# Patient Record
Sex: Male | Born: 2000 | Race: Black or African American | Hispanic: No | Marital: Single | Smoking: Current every day smoker
Health system: Southern US, Community
[De-identification: ages and names within clinical notes are randomized; demographics above are authoritative.]

## PROBLEM LIST (undated history)

## (undated) DIAGNOSIS — I10 Essential (primary) hypertension: Secondary | ICD-10-CM

---
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
## 2022-04-10 IMAGING — DX DG HAND COMPLETE 3+V*R*
3 series · 3 of 3 positions shown · non-contrast
Comparison: None.

CLINICAL DATA: Punched a wall, right hand injury and pain

EXAM:
RIGHT HAND - COMPLETE 3+ VIEW

[hand ap]
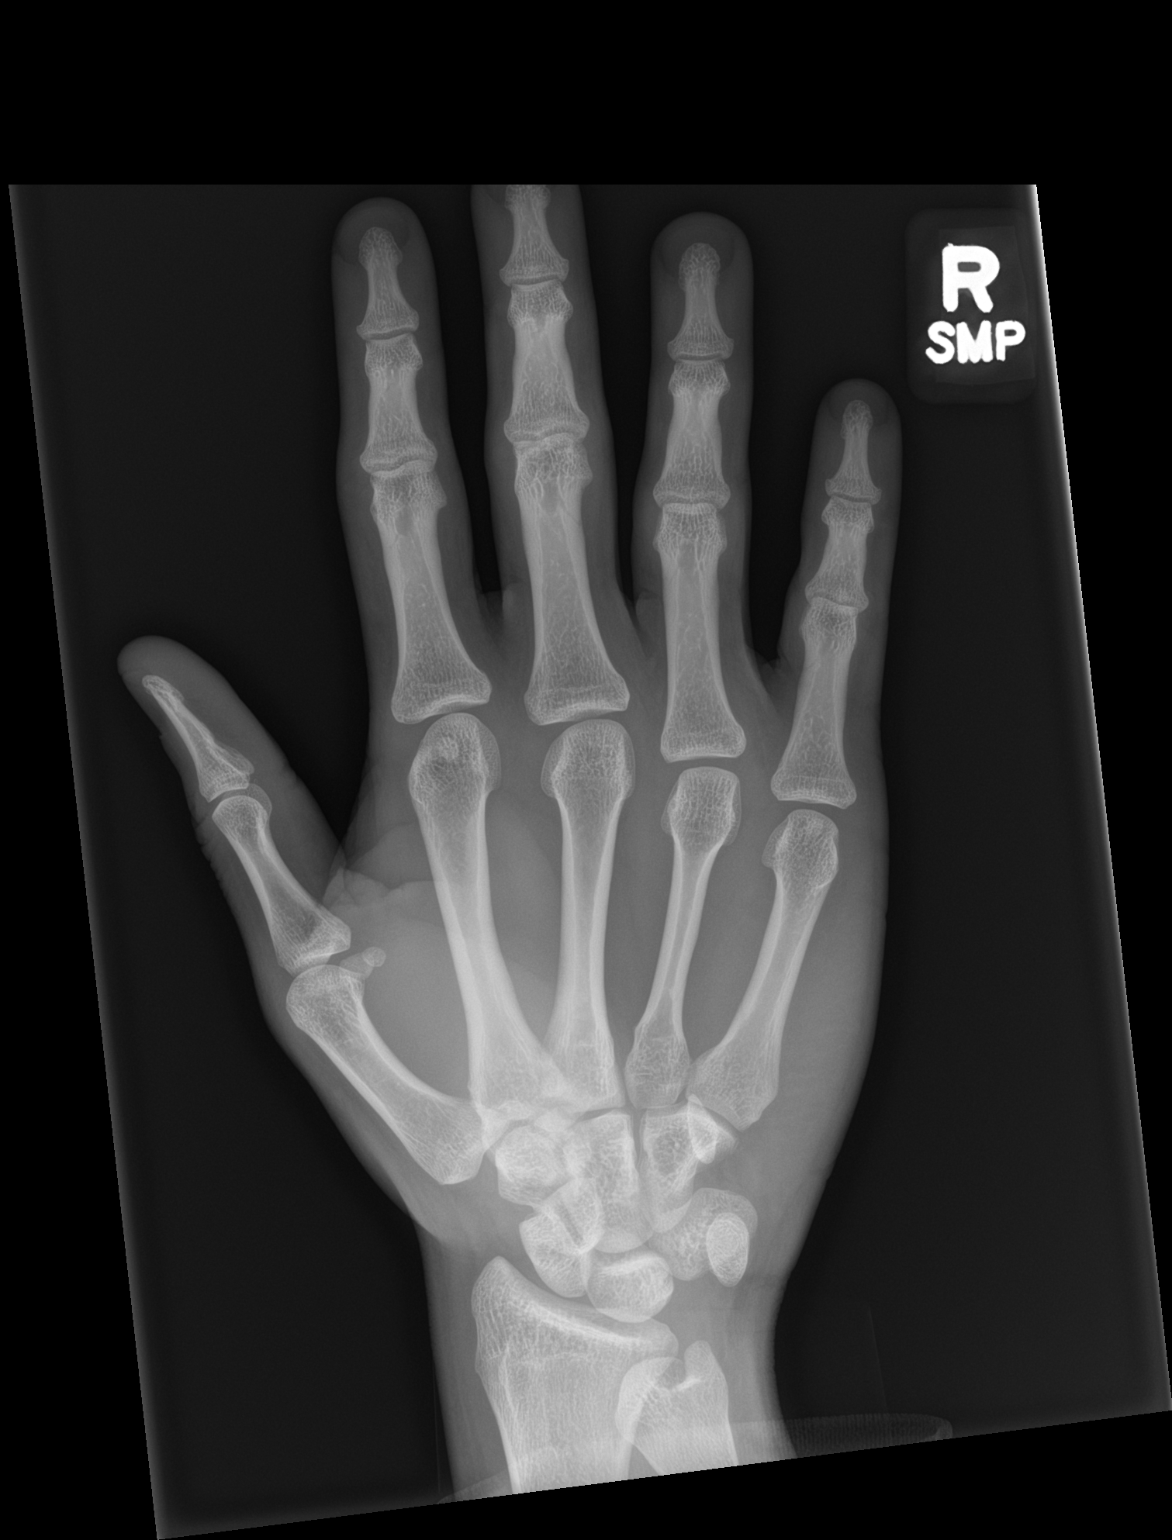

[hand obl]
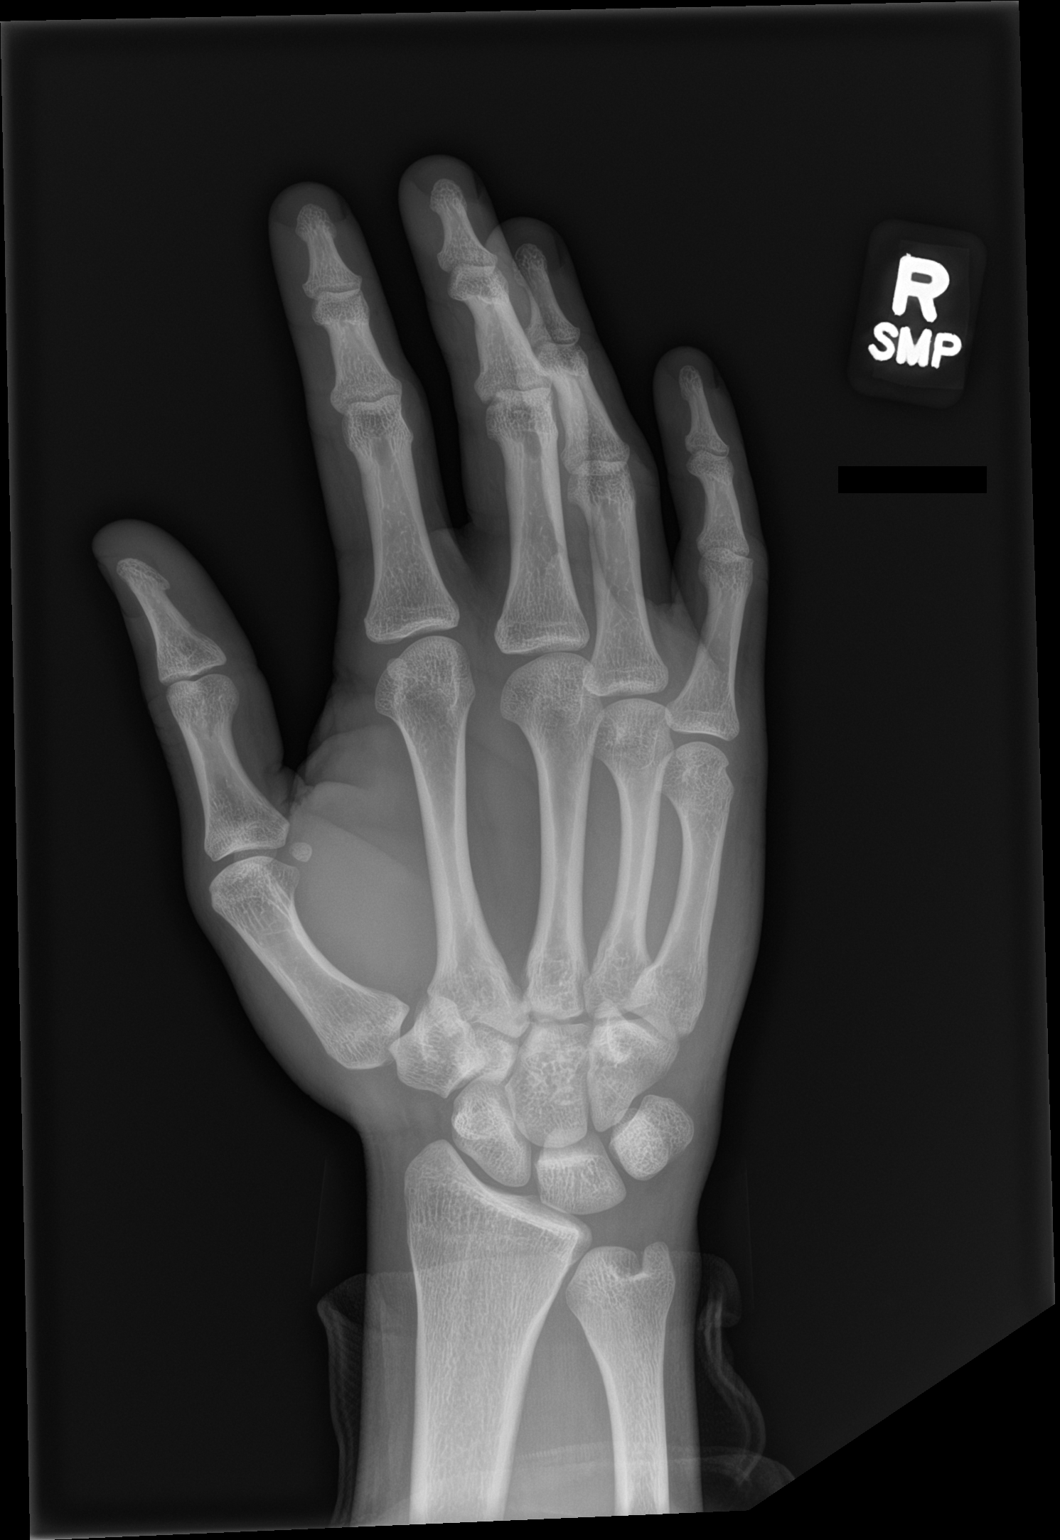

[hand lat]
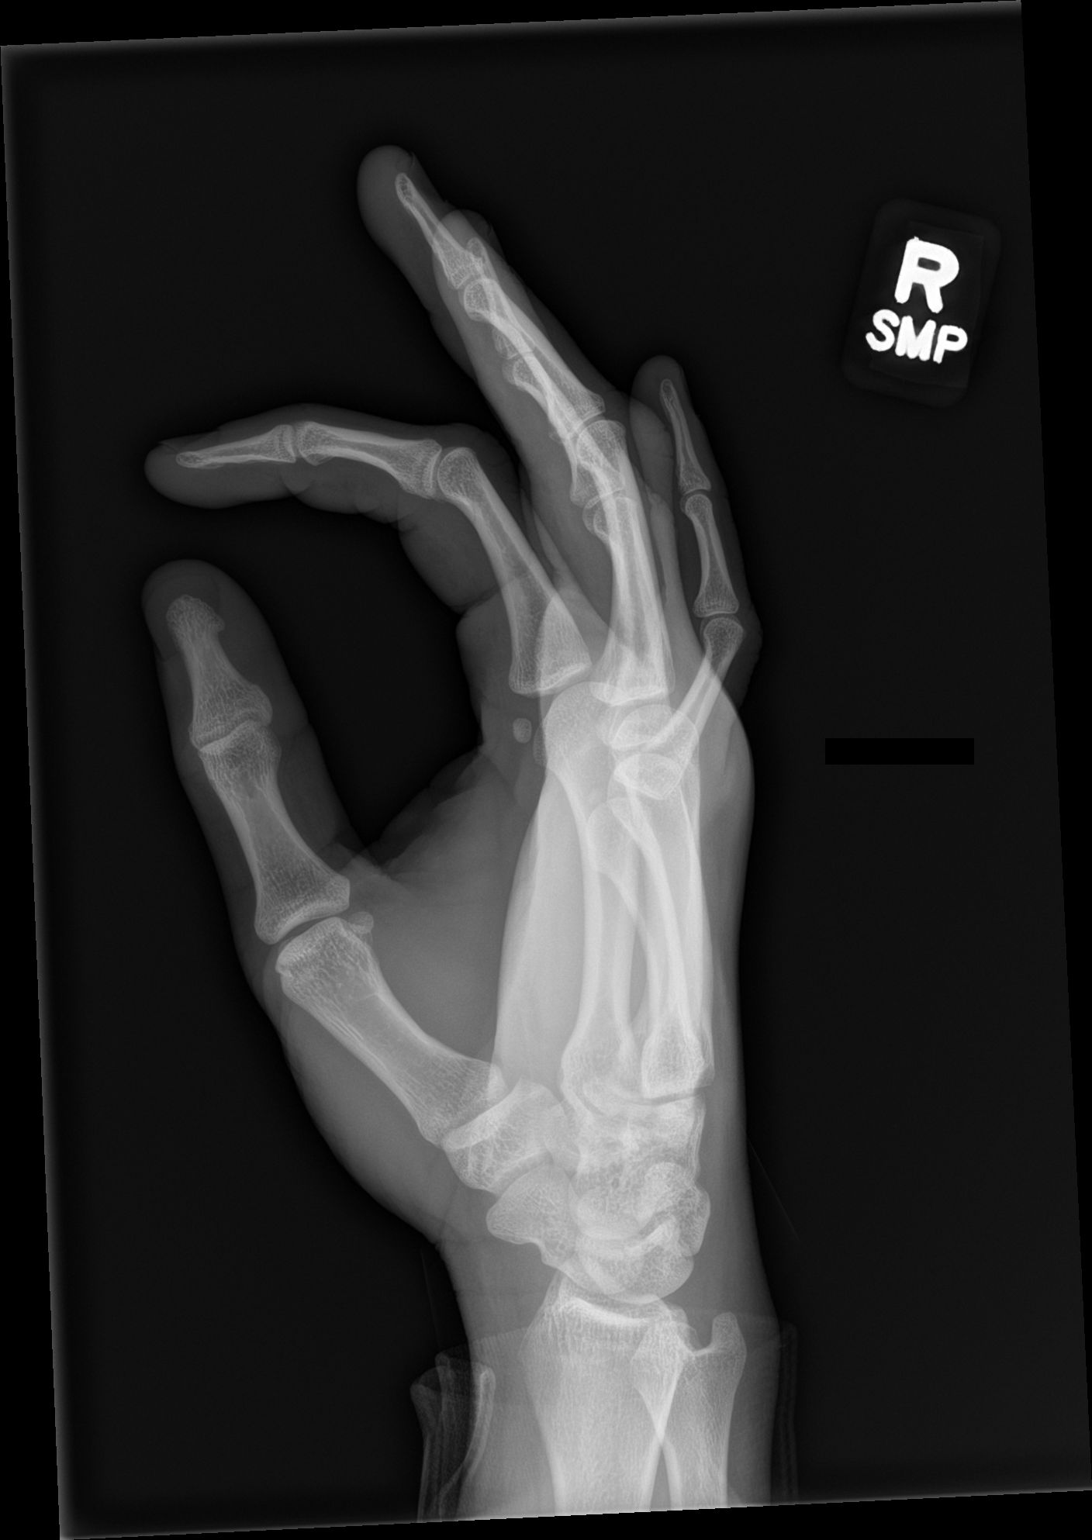

[3 of 3 positions shown; findings below may reference images not displayed]

FINDINGS: Three view radiograph right hand demonstrates normal alignment. No
fracture or dislocation. Joint spaces are preserved. Soft tissue
swelling is seen dorsal to the metacarpal heads.
IMPRESSION: Soft tissue swelling.  No fracture or dislocation.

## 2022-06-03 ENCOUNTER — Other Ambulatory Visit: Payer: Self-pay

## 2022-06-03 ENCOUNTER — Encounter (HOSPITAL_COMMUNITY): Payer: Self-pay | Admitting: *Deleted

## 2022-06-03 ENCOUNTER — Emergency Department (HOSPITAL_COMMUNITY)
Admission: EM | Admit: 2022-06-03 | Discharge: 2022-06-03 | Disposition: A | Discharge status: Home / Self Care | Payer: Medicaid Other | Attending: Emergency Medicine | Admitting: Emergency Medicine

## 2022-06-03 DIAGNOSIS — Y9367 Activity, basketball: Secondary | ICD-10-CM | POA: Insufficient documentation

## 2022-06-03 DIAGNOSIS — I10 Essential (primary) hypertension: Secondary | ICD-10-CM | POA: Insufficient documentation

## 2022-06-03 DIAGNOSIS — H1131 Conjunctival hemorrhage, right eye: Secondary | ICD-10-CM | POA: Diagnosis not present

## 2022-06-03 DIAGNOSIS — W500XXA Accidental hit or strike by another person, initial encounter: Secondary | ICD-10-CM | POA: Diagnosis not present

## 2022-06-03 DIAGNOSIS — S0501XA Injury of conjunctiva and corneal abrasion without foreign body, right eye, initial encounter: Secondary | ICD-10-CM | POA: Insufficient documentation

## 2022-06-03 DIAGNOSIS — S0591XA Unspecified injury of right eye and orbit, initial encounter: Secondary | ICD-10-CM | POA: Diagnosis present

## 2022-06-03 MED ORDER — ERYTHROMYCIN 5 MG/GM OP OINT: TOPICAL_OINTMENT | OPHTHALMIC | 0 refills | Status: AC

## 2022-06-03 MED ORDER — TETRACAINE HCL 0.5 % OP SOLN
2.0000 [drp] | Freq: Once | OPHTHALMIC | Status: AC
  Administered 2022-06-03: 2 [drp] via OPHTHALMIC
  Filled 2022-06-03: qty 4

## 2022-06-03 NOTE — ED Notes (Signed)
Visual acuity at bilateral distance 20/100, pt. reports he is required to wear glasses but at present he is not wearing them.

## 2022-06-03 NOTE — ED Provider Notes (Signed)
[facility] Provider Note   CSN: [id number] Arrival date & time: 06/03/22  1219     History  Chief Complaint  Patient presents with   Eye Problem    Ronald Fuller is a 21 y.o. male.  Patient presents to the hospital complaining of a right eye hemorrhage secondary to a finger getting poked in his eye by accident while playing basketball yesterday.  Patient states he has mild irritation with blinking but denies any vision changes.  Patient denies any other injury.  Patient has history of high blood pressure and no other contributory history  HPI     Home Medications Prior to Admission medications   Medication Sig Start Date End Date Taking? Authorizing Provider  erythromycin ophthalmic ointment Place a 1/2 inch ribbon of ointment into the lower eyelid 4 times daily for the next week 06/03/22  Yes Kell Ferris, Casimer Leek, PA-C  guanFACINE (TENEX) 1 MG tablet Take 1 tablet in the morning, 1 tablet at noon and 0.5 tablet in the afternoon., Patient not taking: Reported on 12/20/2020 03/13/13   [provider]  lisdexamfetamine (VYVANSE) 60 MG capsule Take 1 capsule by mouth daily. Patient not taking: Reported on 12/20/2020 04/14/13   [provider]      Allergies    Patient has no known allergies.    Review of Systems   Review of Systems  Eyes:        Eye "Scratchiness", eye hemorrhage    Physical Exam Updated Vital Signs BP (!) 148/83 (BP Location: Left Arm)   Pulse 60   Temp 98.3 F (36.8 C) (Oral)   Resp 16   Ht 6' (1.829 m)   Wt 84.4 kg   SpO2 100%   BMI 25.23 kg/m  Physical Exam HENT:     Head: Normocephalic and atraumatic.     Nose: Nose normal.     Mouth/Throat:     Mouth: Mucous membranes are moist.  Eyes:     General: Vision grossly intact.     Extraocular Movements: Extraocular movements intact.     Conjunctiva/sclera:     Right eye: Hemorrhage present.     Pupils: Pupils are equal, round, and reactive to light.      Right eye: Corneal abrasion and fluorescein uptake present. Seidel exam negative.     Comments: Subconjunctival hemorrhage noted in the right eye.  Patient noted to have corneal abrasion with fluorescein uptake midline inferiorly middle portion of the inferior portion of the eye  Pulmonary:     Effort: Pulmonary effort is normal. No respiratory distress.  Musculoskeletal:        General: No signs of injury.     Cervical back: Normal range of motion.  Skin:    General: Skin is dry.  Neurological:     Mental Status: He is alert.  Psychiatric:        Speech: Speech normal.        Behavior: Behavior normal.     ED Results / Procedures / Treatments   Labs (all labs ordered are listed, but only abnormal results are displayed) Labs Reviewed - No data to display  EKG None  Radiology No results found.  Procedures Procedures    Medications Ordered in ED Medications  tetracaine (PONTOCAINE) 0.5 % ophthalmic solution 2 drop (2 drops Right Eye Given 06/03/22 1345)    ED Course/ Medical Decision Making/ A&P  Medical Decision Making Risk Prescription drug management.   Patient presents today with complaints of a hemorrhage of the right eye.  Differential diagnosis includes but is not limited to subconjunctival hemorrhage, iritis, hyphema, conjunctivitis, globe rupture, and others  I reviewed the patient's past medical history including visits for right hand injuries and suicidal intent  There is noted presents after lab work-up.  There is no indication at this time for imaging.  The patient's eye exam reveals a corneal abrasion in the inferior portion of the right eye.  His exam is consistent as well with a subconjunctival hemorrhage.  The patient has no pain with eye movements.  No pain with pupillary constriction.  No sign of hyphema.  Negative Seidel sign  The patient presents with subconjunctival hemorrhage and corneal abrasion today.  The corneal  abrasion is best treated with antibiotic eyedrops.  Prescription will be sent.  Patient may follow-up as needed with ophthalmology        Final Clinical Impression(s) / ED Diagnoses Final diagnoses:  Abrasion of right cornea, initial encounter  Subconjunctival hemorrhage of right eye    Rx / DC Orders ED Discharge Orders          Ordered    erythromycin ophthalmic ointment        06/03/22 1400              Ronny Bacon 06/03/22 1401    Wyvonnia Dusky, MD 06/03/22 1534

## 2022-06-03 NOTE — Discharge Instructions (Signed)
You were diagnosed today with a corneal abrasion and subconjunctival hemorrhage of the right eye.  Please place the prescribed antibiotic ointment into your lower eyelid 4 times daily for the next week.  You may follow-up as needed with ophthalmology or optometry for further eye care needs.

## 2022-06-03 NOTE — ED Triage Notes (Signed)
Pt with right eye subconjunctival hemorrhage  after someone's finger went into his eye while playing basketball yesterday.  Pt denies any vision changes.  States some irritation with blinking at times.
